# Patient Record
Sex: Female | Born: 1970 | Race: Black or African American | Hispanic: No | Marital: Married | Smoking: Current every day smoker
Health system: Southern US, Community
[De-identification: ages and names within clinical notes are randomized; demographics above are authoritative.]

## PROBLEM LIST (undated history)

## (undated) DIAGNOSIS — I10 Essential (primary) hypertension: Secondary | ICD-10-CM

## (undated) HISTORY — PX: TUBAL LIGATION: SHX77

---

## 2018-02-15 ENCOUNTER — Other Ambulatory Visit: Payer: Self-pay

## 2018-02-15 ENCOUNTER — Emergency Department (HOSPITAL_BASED_OUTPATIENT_CLINIC_OR_DEPARTMENT_OTHER)
Admission: EM | Admit: 2018-02-15 | Discharge: 2018-02-16 | Disposition: A | Discharge status: Home / Self Care | Payer: BLUE CROSS/BLUE SHIELD | Attending: Emergency Medicine | Admitting: Emergency Medicine

## 2018-02-15 ENCOUNTER — Emergency Department (HOSPITAL_BASED_OUTPATIENT_CLINIC_OR_DEPARTMENT_OTHER): Payer: BLUE CROSS/BLUE SHIELD

## 2018-02-15 ENCOUNTER — Encounter (HOSPITAL_BASED_OUTPATIENT_CLINIC_OR_DEPARTMENT_OTHER): Payer: Self-pay | Admitting: Emergency Medicine

## 2018-02-15 DIAGNOSIS — I10 Essential (primary) hypertension: Secondary | ICD-10-CM | POA: Diagnosis not present

## 2018-02-15 DIAGNOSIS — Z79899 Other long term (current) drug therapy: Secondary | ICD-10-CM | POA: Insufficient documentation

## 2018-02-15 DIAGNOSIS — R531 Weakness: Secondary | ICD-10-CM | POA: Diagnosis not present

## 2018-02-15 DIAGNOSIS — H539 Unspecified visual disturbance: Secondary | ICD-10-CM | POA: Insufficient documentation

## 2018-02-15 DIAGNOSIS — F17200 Nicotine dependence, unspecified, uncomplicated: Secondary | ICD-10-CM | POA: Diagnosis not present

## 2018-02-15 DIAGNOSIS — M79601 Pain in right arm: Secondary | ICD-10-CM | POA: Insufficient documentation

## 2018-02-15 DIAGNOSIS — R29898 Other symptoms and signs involving the musculoskeletal system: Secondary | ICD-10-CM

## 2018-02-15 HISTORY — DX: Essential (primary) hypertension: I10

## 2018-02-15 LAB — COMPREHENSIVE METABOLIC PANEL
ALK PHOS: 57 U/L (ref 38–126)
ALT: 19 U/L (ref 14–54)
AST: 21 U/L (ref 15–41)
Albumin: 3.7 g/dL (ref 3.5–5.0)
Anion gap: 7 (ref 5–15)
BUN: 7 mg/dL (ref 6–20)
CALCIUM: 8.9 mg/dL (ref 8.9–10.3)
CHLORIDE: 104 mmol/L (ref 101–111)
CO2: 27 mmol/L (ref 22–32)
CREATININE: 0.77 mg/dL (ref 0.44–1.00)
Glucose, Bld: 96 mg/dL (ref 65–99)
Potassium: 3.5 mmol/L (ref 3.5–5.1)
Sodium: 138 mmol/L (ref 135–145)
Total Bilirubin: 0.3 mg/dL (ref 0.3–1.2)
Total Protein: 7.5 g/dL (ref 6.5–8.1)

## 2018-02-15 LAB — CBC
HEMATOCRIT: 40.1 % (ref 36.0–46.0)
HEMOGLOBIN: 13.7 g/dL (ref 12.0–15.0)
MCH: 31.4 pg (ref 26.0–34.0)
MCHC: 34.2 g/dL (ref 30.0–36.0)
MCV: 91.8 fL (ref 78.0–100.0)
Platelets: 296 10*3/uL (ref 150–400)
RBC: 4.37 MIL/uL (ref 3.87–5.11)
RDW: 12.2 % (ref 11.5–15.5)
WBC: 9.6 10*3/uL (ref 4.0–10.5)

## 2018-02-15 LAB — APTT: APTT: 29 s (ref 24–36)

## 2018-02-15 LAB — PREGNANCY, URINE: Preg Test, Ur: NEGATIVE

## 2018-02-15 LAB — TROPONIN I

## 2018-02-15 LAB — DIFFERENTIAL
BASOS ABS: 0 10*3/uL (ref 0.0–0.1)
Basophils Relative: 0 %
Eosinophils Absolute: 0.2 10*3/uL (ref 0.0–0.7)
Eosinophils Relative: 2 %
LYMPHS PCT: 44 %
Lymphs Abs: 4.3 10*3/uL — ABNORMAL HIGH (ref 0.7–4.0)
MONO ABS: 1.1 10*3/uL — AB (ref 0.1–1.0)
MONOS PCT: 12 %
NEUTROS ABS: 4.1 10*3/uL (ref 1.7–7.7)
Neutrophils Relative %: 42 %

## 2018-02-15 LAB — CBG MONITORING, ED: Glucose-Capillary: 108 mg/dL — ABNORMAL HIGH (ref 65–99)

## 2018-02-15 LAB — PROTIME-INR
INR: 0.87
Prothrombin Time: 11.7 seconds (ref 11.4–15.2)

## 2018-02-15 MED ORDER — IOPAMIDOL (ISOVUE-370) INJECTION 76%
100.0000 mL | Freq: Once | INTRAVENOUS | Status: AC | PRN
  Administered 2018-02-15: 100 mL via INTRAVENOUS

## 2018-02-15 MED ORDER — DIPHENHYDRAMINE HCL 50 MG/ML IJ SOLN
25.0000 mg | Freq: Once | INTRAMUSCULAR | Status: AC
  Administered 2018-02-15: 25 mg via INTRAVENOUS
  Filled 2018-02-15: qty 1

## 2018-02-15 MED ORDER — PROCHLORPERAZINE EDISYLATE 5 MG/ML IJ SOLN
10.0000 mg | Freq: Once | INTRAMUSCULAR | Status: AC
Start: 2018-02-15 — End: 2018-02-15
  Administered 2018-02-15: 10 mg via INTRAVENOUS
  Filled 2018-02-15: qty 2

## 2018-02-15 NOTE — ED Triage Notes (Addendum)
Pt states she woke up at 1 this morning with arm numbness. Pt states there is pain from bicep into hand. Pt went to bed normal at 21:30. Pt also reports blurred peripheral vision.

## 2018-02-15 NOTE — ED Notes (Signed)
Called [facility] ([facility]) for ED to ED transfer. 

## 2018-02-15 NOTE — ED Provider Notes (Signed)
[facility] EMERGENCY DEPARTMENT Provider Note   CSN: [id number] Arrival date & time: 02/15/18  1713     History   Chief Complaint Chief Complaint  Patient presents with  . arm numbness    HPI Theresa Wolf is a 47 y.o. female.  HPI  Theresa Wolf is a 47yo female with a history of hypertension who presents to the emergency department from [facility] for MRI given new onset right arm numbness and weakness as well as blurred vision.  Patient's last known normal was when she went to bed last night at 9:30 PM.  She states that she woke up at 1 AM this morning and felt as if her right forearm and fingers were numb.  She also reports felt dull and aching pain over the right bicep which extends to the forearm.  Pain becomes sharp and stabbing at times.  Reports that she also has bilateral blurred vision, denies diplopia or missing pieces of her vision.  Shortly after she developed the numbness she reports that she developed a bilateral temporal headache.  Her pain at this time is 3/10 in severity, reports that improved after getting medication at [facility].  She also endorses photophobia.  Denies fever, chills, neck pain/stiffness, dysarthria, dysphagia, facial droop, chest pain, shortness of breath, abdominal pain, nausea/vomiting, dysuria, urinary frequency.  States that she has had headaches in the past but none with neurological symptoms.  Past Medical History:  Diagnosis Date  . Hypertension     There are no active problems to display for this patient.   Past Surgical History:  Procedure Laterality Date  . TUBAL LIGATION      OB History    No data available       Home Medications    Prior to Admission medications   Medication Sig Start Date End Date Taking? Authorizing Provider  losartan-hydrochlorothiazide (HYZAAR) 50-12.5 MG tablet Take 1 tablet by mouth daily.   Yes [provider]  metoprolol tartrate (LOPRESSOR) 25  MG tablet Take 25 mg by mouth 2 (two) times daily.   Yes [provider]  ibuprofen (ADVIL,MOTRIN) 600 MG tablet Take 600 mg by mouth every 6 (six) hours as needed. 01/05/18   [provider]    Family History No family history on file.  Social History Social History   Tobacco Use  . Smoking status: Current Every Day Smoker  . Smokeless tobacco: Never Used  Substance Use Topics  . Alcohol use: No    Frequency: Never  . Drug use: Not on file     Allergies   Penicillins; Oxycodone; and Tape   Review of Systems Review of Systems  Constitutional: Negative for chills and fever.  HENT: Negative for ear pain.   Eyes: Positive for photophobia and visual disturbance (bilateral blurry vision). Negative for discharge and redness.  Respiratory: Negative for shortness of breath.   Cardiovascular: Negative for chest pain.  Gastrointestinal: Negative for abdominal pain, nausea and vomiting.  Genitourinary: Negative for difficulty urinating and dysuria.  Musculoskeletal: Negative for back pain, gait problem, neck pain and neck stiffness.  Skin: Negative for rash.  Neurological: Positive for weakness (right arm), numbness (right forearm) and headaches. Negative for speech difficulty.  Psychiatric/Behavioral: Negative for agitation.     Physical Exam Updated Vital Signs BP 105/77   Pulse 85   Temp 98.3 F (36.8 C) (Oral)   Resp 19   Ht 5\' 4"  (1.626 m)   Wt 104.3  kg (230 lb)   LMP 01/15/2018   SpO2 100%   BMI 39.48 kg/m   Physical Exam  Constitutional: She is oriented to person, place, and time. She appears well-developed and well-nourished. No distress.  Sitting at bedside in no apparent distress.   HENT:  Head: Normocephalic and atraumatic.  Mouth/Throat: Oropharynx is clear and moist. No oropharyngeal exudate.  Eyes: Conjunctivae and EOM are normal. Pupils are equal, round, and reactive to light. Right eye exhibits no discharge. Left eye exhibits no  discharge.  Neck: Normal range of motion. Neck supple.  No midline cervical spine tenderness. No tenderness of the bilateral paraspinal muscles of the cervical spine.   Cardiovascular: Normal rate, regular rhythm and intact distal pulses. Exam reveals no friction rub.  No murmur heard. Pulmonary/Chest: Effort normal and breath sounds normal. No stridor. No respiratory distress. She has no wheezes. She has no rales.  Abdominal: Soft. Bowel sounds are normal. There is no tenderness.  Musculoskeletal:  Bilateral 2+ pitting edema to the knee.  Neurological: She is alert and oriented to person, place, and time. Coordination normal.  Mental Status:  Alert, oriented, thought content appropriate, able to give a coherent history. Speech fluent without evidence of aphasia. Able to follow 2 step commands without difficulty.  Cranial Nerves:  II:  Patient with difficulty seeing right peripheral visual field with right eye. No other visual deficits. , pupils equal, round, reactive to light III,IV, VI: ptosis not present, extra-ocular motions intact bilaterally  V,VII: smile symmetric, facial light touch sensation equal in V1, V2, V3 VIII: hearing grossly normal to voice  X: uvula elevates symmetrically  XI: bilateral shoulder shrug symmetric and strong XII: midline tongue extension without fassiculations Motor:  Normal tone. 5/5 in upper and lower extremities bilaterally including strong and equal grip strength and dorsiflexion/plantar flexion Sensory: Patient states she cannot feel light touch in the right 2nd-5th fingers. Light touch intact in left upper extremity and bilateral lower extremities. Cerebellar: normal finger-to-nose with bilateral upper extremities Gait: normal gait and balance  Skin: She is not diaphoretic.  Psychiatric: She has a normal mood and affect. Her behavior is normal.  Nursing note and vitals reviewed.    ED Treatments / Results  Labs (all labs ordered are listed, but  only abnormal results are displayed) Labs Reviewed  DIFFERENTIAL - Abnormal; Notable for the following components:      Result Value   Lymphs Abs 4.3 (*)    Monocytes Absolute 1.1 (*)    All other components within normal limits  CBG MONITORING, ED - Abnormal; Notable for the following components:   Glucose-Capillary 108 (*)    All other components within normal limits  PROTIME-INR  APTT  CBC  COMPREHENSIVE METABOLIC PANEL  TROPONIN I  PREGNANCY, URINE    EKG  EKG Interpretation  Date/Time:  Sunday February 15 2018 17:50:18 EDT Ventricular Rate:  87 PR Interval:  146 QRS Duration: 76 QT Interval:  358 QTC Calculation: 430 R Axis:   83 Text Interpretation:  Normal sinus rhythm Cannot rule out Anterior infarct , age undetermined Abnormal ECG No previous ECGs available Confirmed by Alvira MondaySchlossman, Erin ([id number]) on 02/15/2018 7:19:03 PM       Radiology Ct Angio Head W Or Wo Contrast  Result Date: 02/15/2018 CLINICAL DATA:  Right arm pain and tingling and numbness since this morning. Headache, hypertension, and blurred vision. EXAM: CT ANGIOGRAPHY HEAD AND NECK TECHNIQUE: Multidetector CT imaging of the head and neck was performed using the standard  protocol during bolus administration of intravenous contrast. Multiplanar CT image reconstructions and MIPs were obtained to evaluate the vascular anatomy. Carotid stenosis measurements (when applicable) are obtained utilizing NASCET criteria, using the distal internal carotid diameter as the denominator. CONTRAST:  ISOVUE-370 IOPAMIDOL (ISOVUE-370) INJECTION 76% COMPARISON:  None. FINDINGS: CTA NECK FINDINGS Aortic arch: Normal appearance. No atheromatous changes or dilatation. Right carotid system: Vessels are smooth and widely patent. No definite atheromatous changes. Left carotid system: Aplastic left ICA with no carotid canal or vessel seen at the skull base. The small left common carotid branches into ECA vessels, including ascending  pharyngeal. Vertebral arteries: No proximal subclavian stenosis. Right vertebral artery is dominant. The left vertebral artery is fenestrated at the proximal V4 segment. Skeleton: No acute or aggressive finding. Other neck: 6.2 x 2.9 cm heterogeneously enhancing mass below the left thyroid. The mass appears extra thyroidal on coronal reformats. No other enhancing mass is seen. Upper chest: Negative Review of the MIP images confirms the above findings CTA HEAD FINDINGS Anterior circulation: Aplastic left ICA. Large left posterior communicating artery and anterior communicating artery. Symmetric flow in MCA and ACA branches. No aneurysm. Posterior circulation: Dominant right vertebral artery which is duplicated beyond the PICA. Fenestrated proximal left V4 segment. No branch occlusion, flow limiting stenosis, beading, or aneurysm. Venous sinuses: Patent. Anatomic variants: As above Delayed phase: No abnormal intracranial enhancement. Review of the MIP images confirms the above findings IMPRESSION: 1. No emergent finding. 2. 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 3. No atheromatous changes. 4. 6.2 x 2.9 cm mass below the left thyroid which could be parathyroid neoplasm, exophytic thyroid nodule, or other mass. Recommend nonemergent follow-up neck sonography and consideration of biopsy. Electronically Signed   By: Marnee Spring M.D.   On: 02/15/2018 19:14   Ct Angio Neck W And/or Wo Contrast  Result Date: 02/15/2018 CLINICAL DATA:  Right arm pain and tingling and numbness since this morning. Headache, hypertension, and blurred vision. EXAM: CT ANGIOGRAPHY HEAD AND NECK TECHNIQUE: Multidetector CT imaging of the head and neck was performed using the standard protocol during bolus administration of intravenous contrast. Multiplanar CT image reconstructions and MIPs were obtained to evaluate the vascular anatomy. Carotid stenosis measurements (when applicable) are obtained  utilizing NASCET criteria, using the distal internal carotid diameter as the denominator. CONTRAST:  ISOVUE-370 IOPAMIDOL (ISOVUE-370) INJECTION 76% COMPARISON:  None. FINDINGS: CTA NECK FINDINGS Aortic arch: Normal appearance. No atheromatous changes or dilatation. Right carotid system: Vessels are smooth and widely patent. No definite atheromatous changes. Left carotid system: Aplastic left ICA with no carotid canal or vessel seen at the skull base. The small left common carotid branches into ECA vessels, including ascending pharyngeal. Vertebral arteries: No proximal subclavian stenosis. Right vertebral artery is dominant. The left vertebral artery is fenestrated at the proximal V4 segment. Skeleton: No acute or aggressive finding. Other neck: 6.2 x 2.9 cm heterogeneously enhancing mass below the left thyroid. The mass appears extra thyroidal on coronal reformats. No other enhancing mass is seen. Upper chest: Negative Review of the MIP images confirms the above findings CTA HEAD FINDINGS Anterior circulation: Aplastic left ICA. Large left posterior communicating artery and anterior communicating artery. Symmetric flow in MCA and ACA branches. No aneurysm. Posterior circulation: Dominant right vertebral artery which is duplicated beyond the PICA. Fenestrated proximal left V4 segment. No branch occlusion, flow limiting stenosis, beading, or aneurysm. Venous sinuses: Patent. Anatomic variants: As above Delayed phase: No abnormal intracranial  enhancement. Review of the MIP images confirms the above findings IMPRESSION: 1. No emergent finding. 2. 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 3. No atheromatous changes. 4. 6.2 x 2.9 cm mass below the left thyroid which could be parathyroid neoplasm, exophytic thyroid nodule, or other mass. Recommend nonemergent follow-up neck sonography and consideration of biopsy. Electronically Signed   By: Marnee Spring M.D.   On:  02/15/2018 19:14   Ct Head Code Stroke Wo Contrast  Result Date: 02/15/2018 CLINICAL DATA:  Code stroke. Woke up with numbness, tingling, and pain in the right arm. EXAM: CT HEAD WITHOUT CONTRAST TECHNIQUE: Contiguous axial images were obtained from the base of the skull through the vertex without intravenous contrast. COMPARISON:  None. FINDINGS: Brain: No evidence of infarction, hemorrhage, hydrocephalus, extra-axial collection or mass lesion/mass effect. Vascular: No hyperdense vessel or unexpected calcification. Skull: Normal. Negative for fracture or focal lesion. Sinuses/Orbits: No acute finding. Other: These results were called by telephone at the time of interpretation on 02/15/2018 at 6:14 pm to Dr. Alvira Monday , who verbally acknowledged these results. ASPECTS [facility] Stroke Program Early CT Score) - Ganglionic level infarction (caudate, lentiform nuclei, internal capsule, insula, M1-M3 cortex): 7 - Supraganglionic infarction (M4-M6 cortex): 3 Total score (0-10 with 10 being normal): 10 IMPRESSION: Negative head CT.  ASPECTS is 10. Electronically Signed   By: Marnee Spring M.D.   On: 02/15/2018 18:15    Procedures Procedures (including critical care time)  Medications Ordered in ED Medications  iopamidol (ISOVUE-370) 76 % injection 100 mL (100 mLs Intravenous Contrast Given 02/15/18 1841)  prochlorperazine (COMPAZINE) injection 10 mg (10 mg Intravenous Given 02/15/18 2044)  diphenhydrAMINE (BENADRYL) injection 25 mg (25 mg Intravenous Given 02/15/18 2044)     Initial Impression / Assessment and Plan / ED Course  I have reviewed the triage vital signs and the nursing notes.  Pertinent labs & imaging results that were available during my care of the patient were reviewed by me and considered in my medical decision making (see chart for details).    Patient presents from [facility] for MRI after code stroke was called given right visual field deficit and right UE numbness on exam.  She noticed symptoms at 1am today, last known normal yesterday evening 9:30PM.   CT head w/o contrast without acute abnormality. CTA head shows no large vessel cut offs.  On my exam, patient with full strength in bilateral UE. She reports loss of sensation to light touch in right 2nd-4th fingers, can feel light touch over her forearm and wrist. Awaiting MRI head and neck for further evaluation.   Other than stroke differential includes complicated migraine or cervical spine radiculopathy. Sign out given at shift change to Dr. Blinda Leatherwood for disposition following MRI head/neck.   Final Clinical Impressions(s) / ED Diagnoses   Final diagnoses:  Right arm weakness  Visual changes    ED Discharge Orders    None       Kellie Shropshire, PA-C 02/16/18 0138    Gilda Crease, MD 02/16/18 [phone]

## 2018-02-15 NOTE — ED Notes (Signed)
[facility]

## 2018-02-15 NOTE — ED Notes (Signed)
Report received . Reported VAN positive per Dr. Dalene SeltzerSchlossman.

## 2018-02-15 NOTE — ED Notes (Signed)
Pt taken to CT (on monitor with RN) and returned to room 4. Teleneuro eval in progress

## 2018-02-15 NOTE — ED Notes (Signed)
Alert, NAD, calm, interactive, resps e/u, speaking in clear complete sentences, no dyspnea noted, skin W&D, VSS, mentions HA, R arm pain and blurry vision, (denies: sob, nausea, or dizziness). [facility]

## 2018-02-15 NOTE — Consult Note (Addendum)
Official CTA H and N IMPRESSION: 1. No emergent finding. 2. 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 3. No atheromatous changes. 4. 6.2 x 2.9 cm mass below the left thyroid which could be parathyroid neoplasm, exophytic thyroid nodule, or other mass. Recommend nonemergent follow-up neck sonography and consideration of Biopsy. Not candidate for NIR.   Date:02/15/18 [facility] Tele[facility]urology Consult Services  Impression: r/o L hemispheric stroke vs complicated migraine   Not a tpa candidate due to: last known normal > 4.5hr Not an NIR candidate due to: CTA H and N ordered by ED attg    Differential Diagnosis:   1. Cardioembolic stroke  2. Small vessel disease/lacune  3. Thromboembolic, artery-to-artery mechanism  4. Hypercoagulable state-related infarct  5. Transient ischemic attack  6. Thrombotic mechanism, large artery disease   Comments:   Last known well:1:00 Door time:17:13 [employer] contacted: 18:36 [employer] at bedside: 18:41 NIHSS assessment time: 19:07 (end time)  Recommendations:  f/u official cta H and N and P results - if neg, antiplatelet therapy if no contraindications inpatient neurology consultation Inpatient stroke evaluation as per Neurology/ Internal Medicine Discussed with ED MD  -----------------------------------------------------------------------------------------  CC SA  History of Present Illness   Patient is a  47yo RH W w hx of HTN, tobacco abuse, on asa who presented with right arm weakness and numbness couldnt feel the "middle" finger.  She had similar symptoms in the past.  No changes to her speech.  Onset at 1:30 last night. She began having a headache and photophobia upon arrival to the ED around 17:00.  Diagnostic: hct IMPRESSION: Negative head CT.  ASPECTS is 10. CTA H - upon my ind review - symmetrical arborization b/l w intracerebral stenosis  Exam:  NIHSS  score:2 R HS 1 L partial visual field defect 1      Medical Decision Making:  - Extensive number of diagnosis or management options are considered above.   - Extensive amount of complex data reviewed.   - High risk of complication and/or morbidity or mortality are associated with differential diagnostic considerations above.  - There may be Uncertain outcome and increased probability of prolonged functional impairment or high probability of severe prolonged functional impairment associated with some of these differential diagnosis.  Medical Data Reviewed:  1.Data reviewed include clinical labs, radiology,  Medical Tests;   2.Tests results discussed w/performing or interpreting physician;   3.Obtaining/reviewing old medical records;  4.Obtaining case history from another source;  5.Independent review of image, tracing or specimen.    Patient was informed the Neurology Consult would happen via telehealth (remote video) and consented to receiving care in this manner.

## 2018-02-15 NOTE — ED Notes (Signed)
Back from b/r, steady gait. No changes. [facility] here. VSS. HA meds given.

## 2018-02-15 NOTE — ED Notes (Signed)
Dr. Dalene SeltzerSchlossman at bedside with pt. States she is not a code stroke. [facility]

## 2018-02-15 NOTE — ED Provider Notes (Signed)
[facility] EMERGENCY DEPARTMENT Provider Note   CSN: [id number] Arrival date & time: 02/15/18  1713     History   Chief Complaint Chief Complaint  Patient presents with  . arm numbness    HPI Theresa Wolf is a 47 y.o. female.  HPI   47 year old female with a history of hypertension presents with concern for right arm numbness and weakness, as well as right arm pain, and change in vision.  Patient's last known normal was 9:30 PM last night.  Reports she woke up at 1 in the morning with the symptoms.  Also reports that she has left-sided headache.  Denies nausea and vomiting.  Denies specific numbness or weakness of her legs.  Reports that she has bilateral lower extremity edema, and has a sensation of chronic weakness in her legs secondary to this, but denies any new or focal changes.  Denies fevers. Describes the blurred vision as blurred vision in both eyes, denies double vision or missing pieces of vision.   Past Medical History:  Diagnosis Date  . Hypertension     There are no active problems to display for this patient.   Past Surgical History:  Procedure Laterality Date  . TUBAL LIGATION      OB History    No data available       Home Medications    Prior to Admission medications   Medication Sig Start Date End Date Taking? Authorizing Provider  losartan-hydrochlorothiazide (HYZAAR) 50-12.5 MG tablet Take 1 tablet by mouth daily.   Yes [provider]  metoprolol tartrate (LOPRESSOR) 25 MG tablet Take 25 mg by mouth 2 (two) times daily.   Yes [provider]    Family History No family history on file.  Social History Social History   Tobacco Use  . Smoking status: Current Every Day Smoker  . Smokeless tobacco: Never Used  Substance Use Topics  . Alcohol use: No    Frequency: Never  . Drug use: Not on file     Allergies   Penicillins   Review of Systems Review of Systems  Constitutional: Negative for fever.    HENT: Negative for sore throat.   Eyes: Negative for visual disturbance.  Respiratory: Negative for cough and shortness of breath.   Cardiovascular: Negative for chest pain.  Gastrointestinal: Negative for abdominal pain, nausea and vomiting.  Genitourinary: Negative for difficulty urinating.  Musculoskeletal: Positive for arthralgias. Negative for back pain and neck pain.  Skin: Negative for rash.  Neurological: Positive for weakness, numbness and headaches. Negative for dizziness, syncope, facial asymmetry and speech difficulty.     Physical Exam Updated Vital Signs BP 123/79   Pulse 90   Temp 98.6 F (37 C)   Resp 16   Ht 5\' 4"  (1.626 m)   Wt 104.3 kg (230 lb)   LMP 01/15/2018   SpO2 100%   BMI 39.48 kg/m   Physical Exam  Constitutional: She is oriented to person, place, and time. She appears well-developed and well-nourished. No distress.  HENT:  Head: Normocephalic and atraumatic.  Eyes: Conjunctivae and EOM are normal.  Neck: Normal range of motion.  Cardiovascular: Normal rate, regular rhythm, normal heart sounds and intact distal pulses. Exam reveals no gallop and no friction rub.  No murmur heard. Pulmonary/Chest: Effort normal and breath sounds normal. No respiratory distress. She has no wheezes. She has no rales.  Abdominal: Soft. She exhibits no distension. There is no tenderness. There is no guarding.  Musculoskeletal: She  exhibits no edema or tenderness.  Neurological: She is alert and oriented to person, place, and time. A sensory deficit (reports numbness right arm) is present. No cranial nerve deficit. Coordination normal. GCS eye subscore is 4. GCS verbal subscore is 5. GCS motor subscore is 6.  Patient without pronator drift, however has weak grip strength and weakness of arm with flexion, extension on right. Reports pain also with these movements.   Left visual field def noted on left eye exam, diff to determine when examining right eye. No right visual  field cut  Skin: Skin is warm and dry. No rash noted. She is not diaphoretic. No erythema.  Nursing note and vitals reviewed.    ED Treatments / Results  Labs (all labs ordered are listed, but only abnormal results are displayed) Labs Reviewed  DIFFERENTIAL - Abnormal; Notable for the following components:      Result Value   Lymphs Abs 4.3 (*)    Monocytes Absolute 1.1 (*)    All other components within normal limits  CBG MONITORING, ED - Abnormal; Notable for the following components:   Glucose-Capillary 108 (*)    All other components within normal limits  PROTIME-INR  APTT  CBC  COMPREHENSIVE METABOLIC PANEL  TROPONIN I  PREGNANCY, URINE  PREGNANCY, URINE    EKG  EKG Interpretation  Date/Time:  Sunday February 15 2018 17:50:18 EDT Ventricular Rate:  87 PR Interval:  146 QRS Duration: 76 QT Interval:  358 QTC Calculation: 430 R Axis:   83 Text Interpretation:  Normal sinus rhythm Cannot rule out Anterior infarct , age undetermined Abnormal ECG No previous ECGs available Confirmed by Alvira Monday ([id number]) on 02/15/2018 7:19:03 PM       Radiology Ct Angio Head W Or Wo Contrast  Result Date: 02/15/2018 CLINICAL DATA:  Right arm pain and tingling and numbness since this morning. Headache, hypertension, and blurred vision. EXAM: CT ANGIOGRAPHY HEAD AND NECK TECHNIQUE: Multidetector CT imaging of the head and neck was performed using the standard protocol during bolus administration of intravenous contrast. Multiplanar CT image reconstructions and MIPs were obtained to evaluate the vascular anatomy. Carotid stenosis measurements (when applicable) are obtained utilizing NASCET criteria, using the distal internal carotid diameter as the denominator. CONTRAST:  ISOVUE-370 IOPAMIDOL (ISOVUE-370) INJECTION 76% COMPARISON:  None. FINDINGS: CTA NECK FINDINGS Aortic arch: Normal appearance. No atheromatous changes or dilatation. Right carotid system: Vessels are smooth and  widely patent. No definite atheromatous changes. Left carotid system: Aplastic left ICA with no carotid canal or vessel seen at the skull base. The small left common carotid branches into ECA vessels, including ascending pharyngeal. Vertebral arteries: No proximal subclavian stenosis. Right vertebral artery is dominant. The left vertebral artery is fenestrated at the proximal V4 segment. Skeleton: No acute or aggressive finding. Other neck: 6.2 x 2.9 cm heterogeneously enhancing mass below the left thyroid. The mass appears extra thyroidal on coronal reformats. No other enhancing mass is seen. Upper chest: Negative Review of the MIP images confirms the above findings CTA HEAD FINDINGS Anterior circulation: Aplastic left ICA. Large left posterior communicating artery and anterior communicating artery. Symmetric flow in MCA and ACA branches. No aneurysm. Posterior circulation: Dominant right vertebral artery which is duplicated beyond the PICA. Fenestrated proximal left V4 segment. No branch occlusion, flow limiting stenosis, beading, or aneurysm. Venous sinuses: Patent. Anatomic variants: As above Delayed phase: No abnormal intracranial enhancement. Review of the MIP images confirms the above findings IMPRESSION: 1. No emergent finding. 2. 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 3. No atheromatous changes. 4. 6.2 x 2.9 cm mass below the left thyroid which could be parathyroid neoplasm, exophytic thyroid nodule, or other mass. Recommend nonemergent follow-up neck sonography and consideration of biopsy. Electronically Signed   By: Marnee Spring M.D.   On: 02/15/2018 19:14   Ct Angio Neck W And/or Wo Contrast  Result Date: 02/15/2018 CLINICAL DATA:  Right arm pain and tingling and numbness since this morning. Headache, hypertension, and blurred vision. EXAM: CT ANGIOGRAPHY HEAD AND NECK TECHNIQUE: Multidetector CT imaging of the head and neck was performed using the  standard protocol during bolus administration of intravenous contrast. Multiplanar CT image reconstructions and MIPs were obtained to evaluate the vascular anatomy. Carotid stenosis measurements (when applicable) are obtained utilizing NASCET criteria, using the distal internal carotid diameter as the denominator. CONTRAST:  ISOVUE-370 IOPAMIDOL (ISOVUE-370) INJECTION 76% COMPARISON:  None. FINDINGS: CTA NECK FINDINGS Aortic arch: Normal appearance. No atheromatous changes or dilatation. Right carotid system: Vessels are smooth and widely patent. No definite atheromatous changes. Left carotid system: Aplastic left ICA with no carotid canal or vessel seen at the skull base. The small left common carotid branches into ECA vessels, including ascending pharyngeal. Vertebral arteries: No proximal subclavian stenosis. Right vertebral artery is dominant. The left vertebral artery is fenestrated at the proximal V4 segment. Skeleton: No acute or aggressive finding. Other neck: 6.2 x 2.9 cm heterogeneously enhancing mass below the left thyroid. The mass appears extra thyroidal on coronal reformats. No other enhancing mass is seen. Upper chest: Negative Review of the MIP images confirms the above findings CTA HEAD FINDINGS Anterior circulation: Aplastic left ICA. Large left posterior communicating artery and anterior communicating artery. Symmetric flow in MCA and ACA branches. No aneurysm. Posterior circulation: Dominant right vertebral artery which is duplicated beyond the PICA. Fenestrated proximal left V4 segment. No branch occlusion, flow limiting stenosis, beading, or aneurysm. Venous sinuses: Patent. Anatomic variants: As above Delayed phase: No abnormal intracranial enhancement. Review of the MIP images confirms the above findings IMPRESSION: 1. No emergent finding. 2. 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 3. No atheromatous changes. 4. 6.2 x 2.9 cm mass below the  left thyroid which could be parathyroid neoplasm, exophytic thyroid nodule, or other mass. Recommend nonemergent follow-up neck sonography and consideration of biopsy. Electronically Signed   By: Marnee Spring M.D.   On: 02/15/2018 19:14   Ct Head Code Stroke Wo Contrast  Result Date: 02/15/2018 CLINICAL DATA:  Code stroke. Woke up with numbness, tingling, and pain in the right arm. EXAM: CT HEAD WITHOUT CONTRAST TECHNIQUE: Contiguous axial images were obtained from the base of the skull through the vertex without intravenous contrast. COMPARISON:  None. FINDINGS: Brain: No evidence of infarction, hemorrhage, hydrocephalus, extra-axial collection or mass lesion/mass effect. Vascular: No hyperdense vessel or unexpected calcification. Skull: Normal. Negative for fracture or focal lesion. Sinuses/Orbits: No acute finding. Other: These results were called by telephone at the time of interpretation on 02/15/2018 at 6:14 pm to Dr. Alvira Monday , who verbally acknowledged these results. ASPECTS [facility] Stroke Program Early CT Score) - Ganglionic level infarction (caudate, lentiform nuclei, internal capsule, insula, M1-M3 cortex): 7 - Supraganglionic infarction (M4-M6 cortex): 3 Total score (0-10 with 10 being normal): 10 IMPRESSION: Negative head CT.  ASPECTS is 10. Electronically Signed   By: Marnee Spring M.D.   On: 02/15/2018 18:15    Procedures Procedures (including critical care  time)  Medications Ordered in ED Medications  prochlorperazine (COMPAZINE) injection 10 mg (not administered)  diphenhydrAMINE (BENADRYL) injection 25 mg (not administered)  iopamidol (ISOVUE-370) 76 % injection 100 mL (100 mLs Intravenous Contrast Given 02/15/18 1841)     Initial Impression / Assessment and Plan / ED Course  I have reviewed the triage vital signs and the nursing notes.  Pertinent labs & imaging results that were available during my care of the patient were reviewed by me and considered in my medical  decision making (see chart for details).    47 year old female with a history of hypertension presents with concern for right arm numbness and weakness, as well as right arm pain, and change in vision.  Called code stroke given patient with visual field deficit as well as right UE numbness/weakness-pt VAN Positive within 24hr of symptom onset.  Not a candidate for tPA given timing.  Teleneurology evaluated.  CTA shows no large vessel cut offs, shows aplastic left ICA with intracranial collateral. Discussed incidental left thyroid mass vs nodule which pt will follo wup with [place] with PCP . (has hx of prior thyroid nodule biopsies.)   Will transfer to [facility] for MRI. Would do w/ and w/o contrast to also evaluate for possible MS. If MRI shows no sign of CVA, other possibilities of etiology of her right arm pain weakness and numbness, as well as her change in vision would be complex migraine, MSK or cervical etiology of right shoulder and arm pain such as rotator cuff.    Final Clinical Impressions(s) / ED Diagnoses   Final diagnoses:  Right arm weakness  Visual changes    ED Discharge Orders    None       Alvira Monday, MD 02/15/18 2011

## 2018-02-16 ENCOUNTER — Emergency Department (HOSPITAL_COMMUNITY): Payer: BLUE CROSS/BLUE SHIELD

## 2018-02-16 MED ORDER — PREDNISONE 20 MG PO TABS: ORAL_TABLET | ORAL | 0 refills | Status: AC

## 2018-02-16 NOTE — ED Provider Notes (Signed)
Patient presented to the ER with right arm numbness, tingling and occasional pain  Face to face Exam: HEENT - PERRLA Lungs - CTAB Heart - RRR, no M/R/G Abd - S/NT/ND Neuro - alert, oriented x3; normal grip strength bilaterally, subjectively decreased sensation on right arm, but no obvious focal deficits noted  Plan: Patient underwent MRI brain and cervical spine.  Patient had nonspecific T2 flair hyperintense white matter foci that did not meet criteria for multiple sclerosis.  This was discussed with Dr. Wilford CornerArora, on-call for neurology.  He has reviewed images.  He did not feel that this would likely require any further follow-up, but it is reasonable for the patient have an outpatient neurology evaluation.  No specific intervention required at this time for MRI brain findings.  Patient does have evidence of bulging disks and cord compression on her cervical MRI which likely explains the right arm symptoms.  She does not have any weakness or obvious deficit and therefore can follow-up as an outpatient.  Will treat with prednisone taper.  Will refer to neurosurgery.   Gilda CreasePollina, Jetson Pickrel J, MD 02/16/18 [phone]

## 2018-02-16 NOTE — ED Notes (Signed)
Patient transported to MRI 

## 2018-02-16 NOTE — Discharge Instructions (Signed)
Schedule follow-up with one of the 2 listed neurology groups ([facility] or at [facility]) for a follow-up and review of your MRI of your brain.  Schedule follow-up with Dr. Conchita ParisNundkumar to have your neck looked at, as I think that bulging disks in the neck are causing your arm numbness.

## 2019-09-29 IMAGING — MR MR HEAD WO/W CM
13 of 16 series · 26 of 48 positions shown · IV contrast (multihance)
Comparison: MRI cervical spine.

CLINICAL DATA: 46 y/o F; new onset right arm weakness and numbness
at [DATE] a.m. 02/15/2018. Headache and photophobia at 7655 hours.
Concern for multiple sclerosis.

EXAM:
MRI HEAD WITHOUT AND WITH CONTRAST
TECHNIQUE: Multiplanar, multiecho pulse sequences of the brain and surrounding
structures were obtained without and with intravenous contrast.
CONTRAST:  20 cc MultiHance

[Series 4: DWI · axial · 3.0mm · 0.94mm/px · z∈[+35,+182]mm · 2 of 100 slices shown (1 of 2)]
[im 1/100]
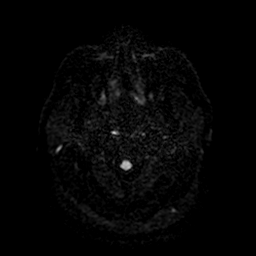
[im 100/100]
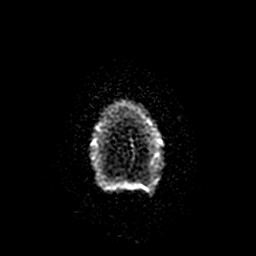

[Series 5: FLAIR · sagittal · 5.0mm · 0.47mm/px · 1 of 23 slices shown (1 of 3)]
[im 1/23]
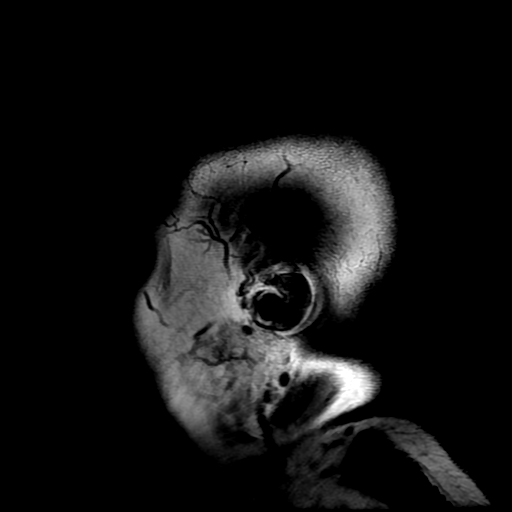

[Series 6: DWI · coronal · 4.0mm · 0.94mm/px · 2 of 68 slices shown (2 of 2)]
[im 1/68]
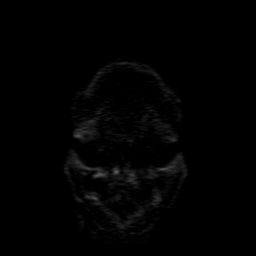
[im 68/68]
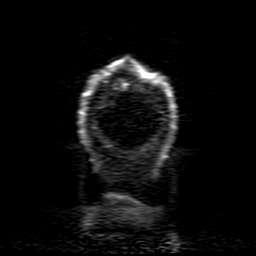

[Series 7: T2 · axial · 5.0mm · 0.43mm/px · 1 of 24 slices shown]
[im 1/24]
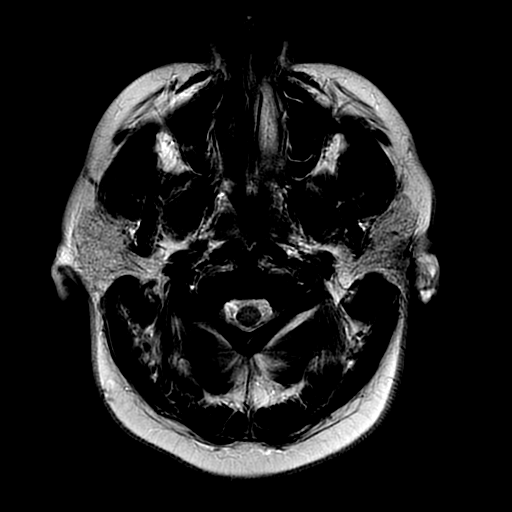

[Series 8: FLAIR · axial · 3.0mm · 0.43mm/px · 1 of 24 slices shown (2 of 3)]
[im 1/24]
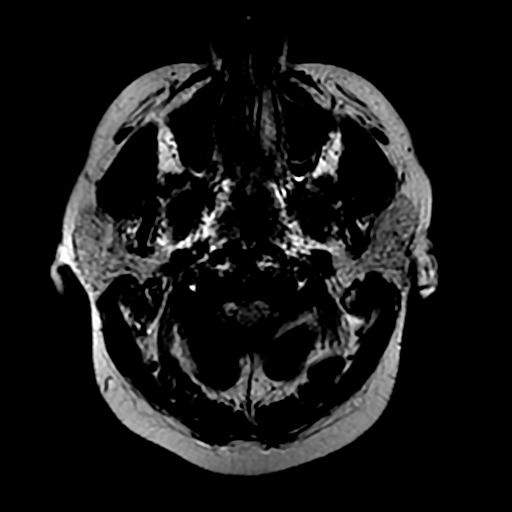

[Series 9: (person_name) · axial · 3.0mm · 0.47mm/px · z∈[+31,+173]mm · 3 of 96 slices shown]
[im 1/96]
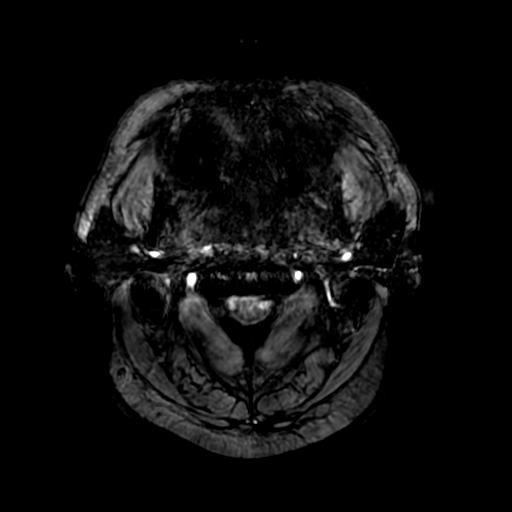
[im 48/96]
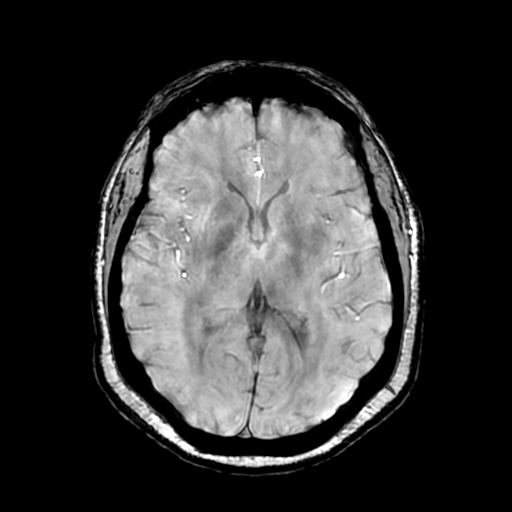
[im 96/96]
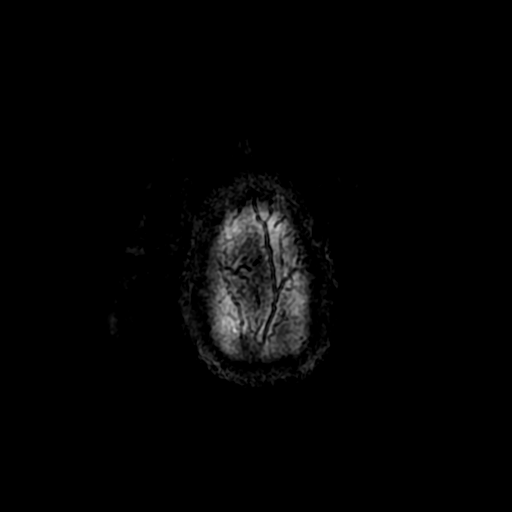

[Series 10: FLAIR · sagittal · 1.6mm · 0.49mm/px · 7 of 200 slices shown (3 of 3)]
[im 1/200]
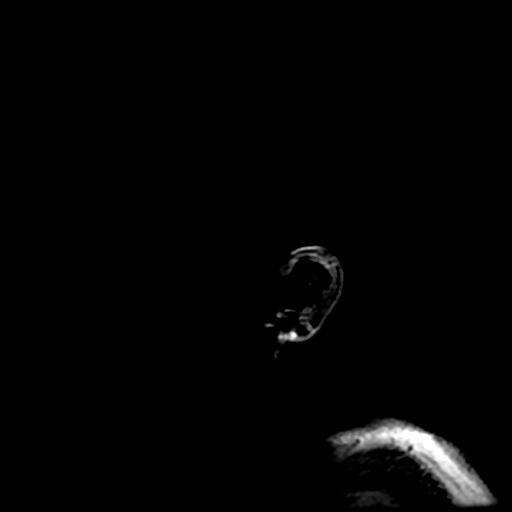
[im 34/200]
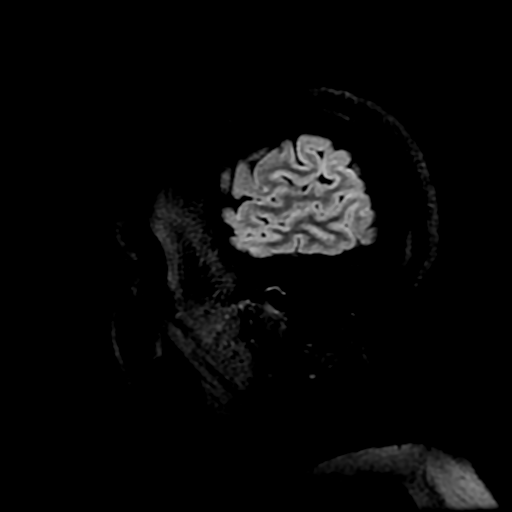
[im 67/200]
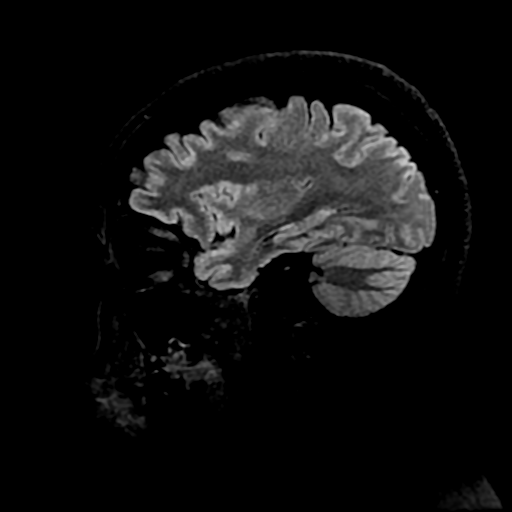
[im 100/200]
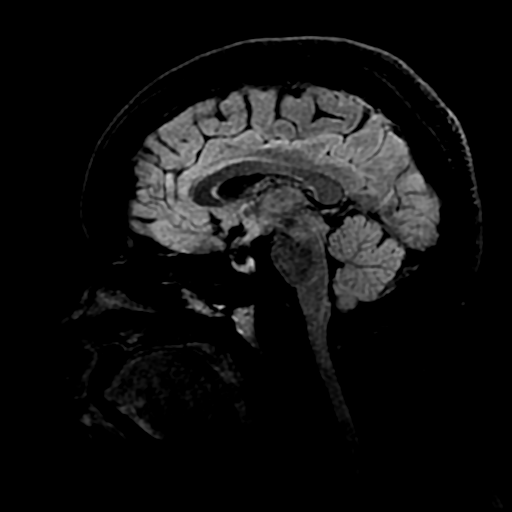
[im 133/200]
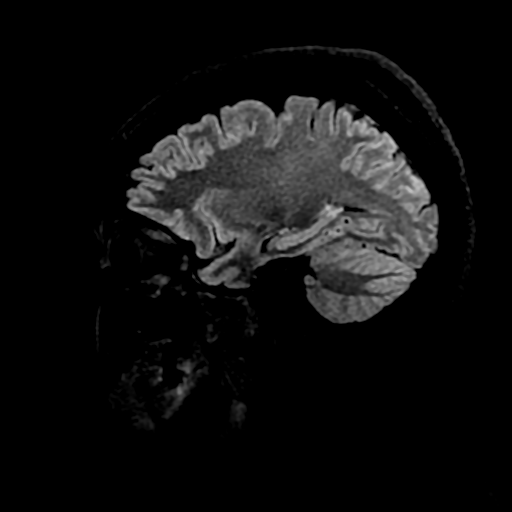
[im 166/200]
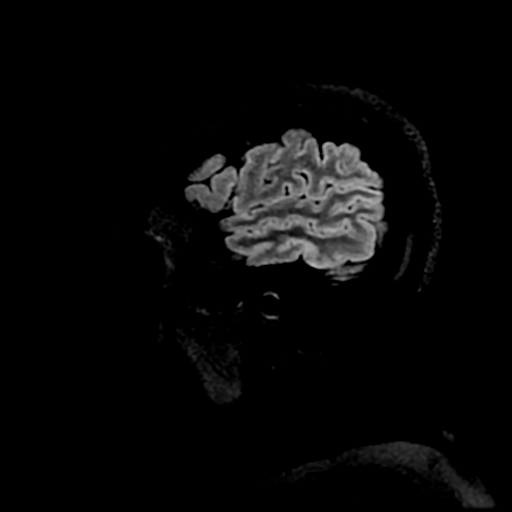
[im 200/200]
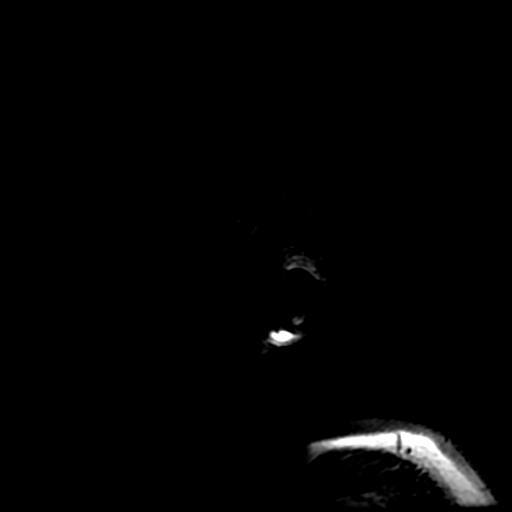

[Series 11: ax 3(person_name) · axial · 3.0mm · 0.94mm/px · z∈[+34,+169]mm · 2 of 46 slices shown]
[im 1/46]
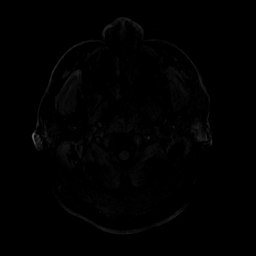
[im 46/46]
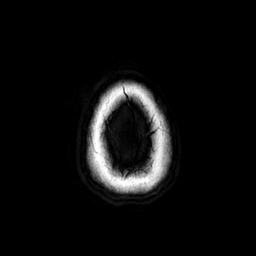

[Series 12: T2 post-contrast · coronal · 5.0mm · 0.39mm/px · 1 of 30 slices shown]
[im 1/30]
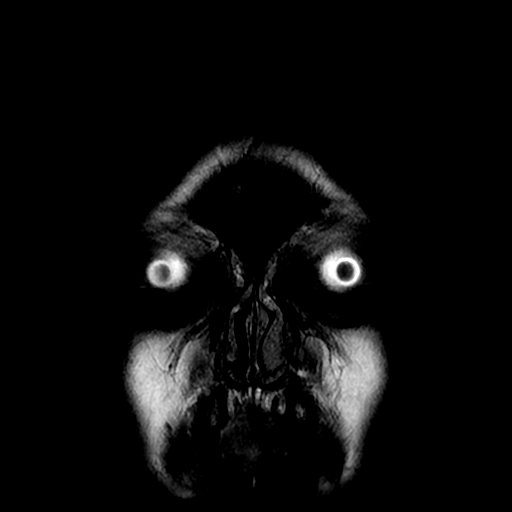

[Series 14: ax 3(person_name) +c · axial · 3.0mm · 0.94mm/px · z∈[+34,+169]mm · 2 of 46 slices shown]
[im 1/46]
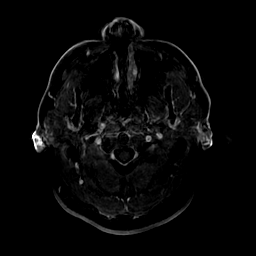
[im 46/46]
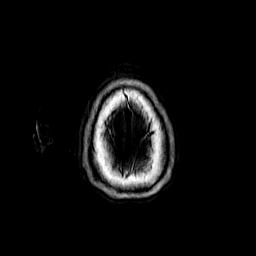

[Series 15: T1 · coronal · 5.0mm · 0.39mm/px · 1 of 30 slices shown]
[im 1/30]
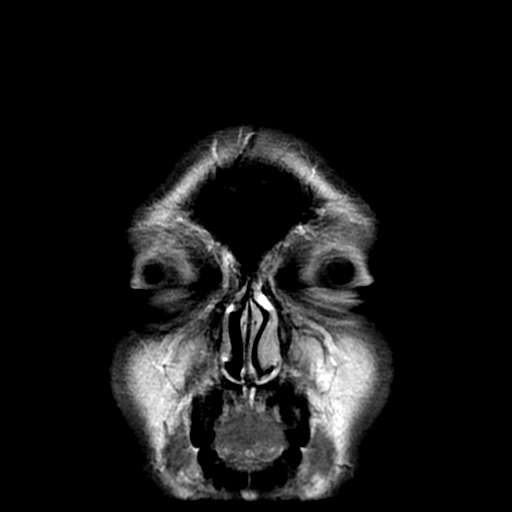

[Series 450: ADC · axial · 3.0mm · 0.94mm/px · z∈[+35,+182]mm · 2 of 49 slices shown (1 of 2)]
[im 1/49]
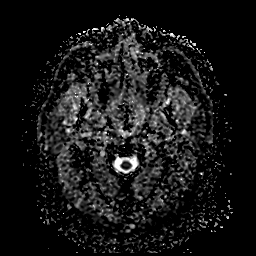
[im 49/49]
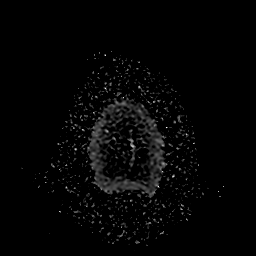

[Series 650: ADC · coronal · 4.0mm · 0.94mm/px · 1 of 34 slices shown (2 of 2)]
[im 1/34]
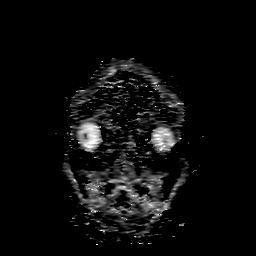

[26 of 48 positions shown; findings below may reference images not displayed]

FINDINGS: Brain: There are 4 T2 FLAIR hyperintense foci within white matter
found in the left frontal subcortical white matter (series 10, image
56), left parietal periventricular white matter (series 10, image
71), left frontal periventricular white matter (series 10 image
116), and left parietal subcortical white matter (series 10, image
119). No lesion is present within juxta cortical white matter,
brainstem, or infratentorial white matter. After administration of
intravenous contrast there is no abnormal enhancement. No reduced
diffusion to suggest acute or early subacute infarction. No abnormal
susceptibility hypointensity to indicate intracranial hemorrhage. No
structural abnormality of the brain, hydrocephalus, extra-axial
collection, or effacement of basilar cisterns.

Vascular: Normal flow voids.

Skull and upper cervical spine: Normal marrow signal.

Sinuses/Orbits: Negative.

Other: None.
IMPRESSION: Nonspecific T2 FLAIR hyperintense white matter foci may represent
microvascular ischemic changes particularly in the setting of
diabetes or hypertension with some differential considerations
including migraine headache or sequelae of demyelination, and other
infectious/inflammatory processes. Findings do not meet revised
[REDACTED] criteria for multiple sclerosis at this time.

By: Dregg M.D.

## 2024-07-31 LAB — AMB RESULTS CONSOLE CBG: Glucose: 98

## 2024-07-31 NOTE — Progress Notes (Signed)
 Pt BP was 136/83. Pt has a pcp. Pt did not screen for SDOH. Pt does smoke.

## 2024-09-23 NOTE — Progress Notes (Addendum)
 The patient attended a screening event on 07/31/2024 where her BP screening results was 136/83, non-fasting blood glucose 98. At the event the patient noted she has none insurance and pt does smoke tobacco. Patient did not screen for SDOH insecurities. Pt listed pcp as Dr. Suzann Bryant Hedgecock . Per chart review pt has a pcp and the last office visit was 02/19/2024 for annual exam. The pt BP was 116/79 on 02/19/2024. According to chart pt was recommended to continue with regular exercise and get periodic dental and vision checks. Per chart review pt insurance in [facility] is [employer]. Chart review does not indicates a future appt with pcp. No additional Health equity team support indicated at this time.
